# Patient Record
Sex: Female | Born: 1975 | Race: White | Hispanic: No | Marital: Married | Smoking: Never smoker
Health system: Southern US, Community
[De-identification: ages and names within clinical notes are randomized; demographics above are authoritative.]

## PROBLEM LIST (undated history)

## (undated) DIAGNOSIS — E119 Type 2 diabetes mellitus without complications: Secondary | ICD-10-CM

## (undated) DIAGNOSIS — E785 Hyperlipidemia, unspecified: Secondary | ICD-10-CM

## (undated) DIAGNOSIS — F32A Depression, unspecified: Secondary | ICD-10-CM

## (undated) DIAGNOSIS — G4730 Sleep apnea, unspecified: Secondary | ICD-10-CM

## (undated) DIAGNOSIS — I10 Essential (primary) hypertension: Secondary | ICD-10-CM

## (undated) DIAGNOSIS — F329 Major depressive disorder, single episode, unspecified: Secondary | ICD-10-CM

## (undated) HISTORY — PX: ABDOMINAL HYSTERECTOMY: SHX81

## (undated) HISTORY — PX: GALLBLADDER SURGERY: SHX652

## (undated) HISTORY — PX: OTHER SURGICAL HISTORY: SHX169

---

## 2017-12-23 ENCOUNTER — Ambulatory Visit (INDEPENDENT_AMBULATORY_CARE_PROVIDER_SITE_OTHER): Payer: Managed Care, Other (non HMO)

## 2017-12-23 ENCOUNTER — Ambulatory Visit
Admission: EM | Admit: 2017-12-23 | Discharge: 2017-12-23 | Disposition: A | Discharge status: Home / Self Care | Payer: Managed Care, Other (non HMO) | Attending: Family Medicine | Admitting: Family Medicine

## 2017-12-23 ENCOUNTER — Encounter: Payer: Self-pay | Admitting: Emergency Medicine

## 2017-12-23 ENCOUNTER — Other Ambulatory Visit: Payer: Self-pay

## 2017-12-23 DIAGNOSIS — M722 Plantar fascial fibromatosis: Secondary | ICD-10-CM

## 2017-12-23 DIAGNOSIS — M79671 Pain in right foot: Secondary | ICD-10-CM

## 2017-12-23 HISTORY — DX: Type 2 diabetes mellitus without complications: E11.9

## 2017-12-23 HISTORY — DX: Essential (primary) hypertension: I10

## 2017-12-23 HISTORY — DX: Major depressive disorder, single episode, unspecified: F32.9

## 2017-12-23 HISTORY — DX: Sleep apnea, unspecified: G47.30

## 2017-12-23 HISTORY — DX: Hyperlipidemia, unspecified: E78.5

## 2017-12-23 HISTORY — DX: Depression, unspecified: F32.A

## 2017-12-23 MED ORDER — MELOXICAM 15 MG PO TABS: 15.0000 mg | ORAL_TABLET | Freq: Every day | ORAL | 0 refills | Status: DC | PRN

## 2017-12-23 NOTE — ED Provider Notes (Signed)
[facility] ____________________________________________  Time seen: Approximately 5:58 PM  I have reviewed the triage vital signs and the nursing notes.   HISTORY  Chief Complaint Foot Pain   HPI Alicia Morris is a 42 y.o. female presented for evaluation of right plantar foot pain since yesterday.  States she just recently started a new job yesterday and walked 3 miles which was atypical for her.  Patient states there were no falls or direct injuries or abrupt onset of pain.  States while walking she felt very minimal pain to her right heel that gradually worsened last night and had further worsened when she woke up this morning.  States pain to put any weight on her foot.  Has continued to ambulate but putting weight on her toes.  States that she has full range of motion but pain with moving her foot up and down.  Patient reports that she has a history of plantar fasciitis bilateral, but has not had any issues in several years.  Has tried over-the-counter NSAID and compression sleeves to foot today.  Denies other aggravating or alleviating factors.  No rash, pain radiation, paresthesias, extremity swelling or other complaints.  Reports otherwise feels well. Denies recent sickness. Denies recent antibiotic use.    Past Medical History:  Diagnosis Date  . Depression   . Diabetes mellitus without complication (HCC)   . Hyperlipidemia   . Hypertension   . Sleep apnea     There are no active problems to display for this patient.   Past Surgical History:  Procedure Laterality Date  . ABDOMINAL HYSTERECTOMY    . CESAREAN SECTION    . DNC    . GALLBLADDER SURGERY       No current facility-administered medications for this encounter.   Current Outpatient Medications:  .  FLUoxetine (PROZAC) 40 MG capsule, Take 40 mg by mouth daily., Disp: , Rfl:  .  losartan (COZAAR) 50 MG tablet, Take 50 mg by mouth daily., Disp: , Rfl:  .  metFORMIN (GLUCOPHAGE) 1000 MG tablet, Take  1,000 mg by mouth 2 (two) times daily with a meal., Disp: , Rfl:  .  meloxicam (MOBIC) 15 MG tablet, Take 1 tablet (15 mg total) by mouth daily as needed., Disp: 7 tablet, Rfl: 0  Allergies Codeine  No family history on file.  Social History Social History   Tobacco Use  . Smoking status: Never Smoker  . Smokeless tobacco: Never Used  Substance Use Topics  . Alcohol use: Never    Frequency: Never  . Drug use: Never    Review of Systems Constitutional: No fever/chills Cardiovascular: Denies chest pain. Respiratory: Denies shortness of breath. Gastrointestinal: No abdominal pain.   Musculoskeletal: Negative for back pain. As above. Skin: Negative for rash.   ____________________________________________   PHYSICAL EXAM:  VITAL SIGNS: ED Triage Vitals  Enc Vitals Group     BP 12/23/17 1735 126/67     Pulse Rate 12/23/17 1735 74     Resp 12/23/17 1735 16     Temp 12/23/17 1735 98.8 F (37.1 C)     Temp Source 12/23/17 1735 Oral     SpO2 12/23/17 1735 98 %     Weight 12/23/17 1737 250 lb (113.4 kg)     Height 12/23/17 1737 5\' 4"  (1.626 m)     Head Circumference --      Peak Flow --      Pain Score 12/23/17 1734 7     Pain Loc --  Pain Edu? --      Excl. in [place]? --     Constitutional: Alert and oriented. Well appearing and in no acute distress. ENT      Head: Normocephalic and atraumatic. Cardiovascular: Normal rate, regular rhythm. Grossly normal heart sounds.  Good peripheral circulation. Respiratory: Normal respiratory effort without tachypnea nor retractions. Breath sounds are clear and equal bilaterally. No wheezes, rales, rhonchi. Musculoskeletal: Bilateral pedal pulses equal and easily palpated. Except: Right plantar heel and proximal arch moderate tenderness to direct palpation, no swelling, no ecchymosis, skin intact, full range of motion present to right foot but pain present with plantarflexion and dorsiflexion, normal distal sensation and capillary  refill, no point bony tenderness.Antalgic gait.  Neurologic:  Normal speech and language.  Speech is normal.  Skin:  Skin is warm, dry and intact. No rash noted. Psychiatric: Mood and affect are normal. Speech and behavior are normal. Patient exhibits appropriate insight and judgment   ___________________________________________   LABS (all labs ordered are listed, but only abnormal results are displayed)  Labs Reviewed - No data to display  RADIOLOGY  Dg Foot Complete Right  Result Date: 12/23/2017 CLINICAL DATA:  Right foot pain since yesterday. EXAM: RIGHT FOOT COMPLETE - 3+ VIEW COMPARISON:  None. FINDINGS: There is no evidence of fracture or dislocation. There is no evidence of arthropathy or other focal bone abnormality. Soft tissues are unremarkable. IMPRESSION: Negative. Electronically Signed   By: Kennith Center M.D.   On: 12/23/2017 18:17   ____________________________________________   PROCEDURES Procedures     INITIAL IMPRESSION / ASSESSMENT AND PLAN / ED COURSE  Pertinent labs & imaging results that were available during my care of the patient were reviewed by me and considered in my medical decision making (see chart for details).   Well-appearing patient.  No acute distress.  Right plantar foot pain.  Suspect plantar fasciitis, also discussed as weakness of pain onset, concern also for partial tear.  Will evaluate x-ray.  X-ray as above per radiologist and reviewed with patient.  Will place in Cam walking boot.  Daily Mobic.  Follow-up with podiatry.  Encouraged ice, stretching, supportive care.Discussed indication, risks and benefits of medications with patient.  Discussed follow up and return parameters including no resolution or any worsening concerns. Patient verbalized understanding and agreed to plan.   ____________________________________________   FINAL CLINICAL IMPRESSION(S) / ED DIAGNOSES  Final diagnoses:  Foot pain, right  Plantar fasciitis      ED Discharge Orders        Ordered    meloxicam (MOBIC) 15 MG tablet  Daily PRN     12/23/17 1826       Note: This dictation was prepared with Dragon dictation along with smaller phrase technology. Any transcriptional errors that result from this process are unintentional.         Renford Dills, NP 12/23/17 1836

## 2017-12-23 NOTE — Discharge Instructions (Addendum)
Take medication as prescribed. Rest. Drink plenty of fluids.use walker boot.  Follow-up with podiatry this week  Follow up with your primary care physician this week as needed. Return to Urgent care for new or worsening concerns.

## 2017-12-23 NOTE — ED Triage Notes (Signed)
Patient stated that she has painful right foot pain that started on yesterday. Hard to walk or put pressure on foot. Pain mainly toward arch and heel of foot.

## 2019-08-27 ENCOUNTER — Ambulatory Visit: Payer: Self-pay | Attending: Internal Medicine

## 2019-08-27 DIAGNOSIS — Z23 Encounter for immunization: Secondary | ICD-10-CM

## 2019-08-27 NOTE — Progress Notes (Signed)
   Covid-19 Vaccination Clinic  Name:  Rylynne Schicker    MRN: [id number] DOB: 1976/04/25  08/27/2019  Ms. Yaworski was observed post Covid-19 immunization for 15 minutes without incident. She was provided with Vaccine Information Sheet and instruction to access the V-Safe system.   Ms. Lupa was instructed to call 911 with any severe reactions post vaccine: Marland Kitchen Difficulty breathing  . Swelling of face and throat  . A fast heartbeat  . A bad rash all over body  . Dizziness and weakness   Immunizations Administered    Name Date Dose VIS Date Route   Pfizer COVID-19 Vaccine 08/27/2019 11:07 AM 0.3 mL 05/06/2019 Intramuscular   Manufacturer: [employer], [employer]   Lot: [id number]   NDC: [address]-5

## 2019-09-17 ENCOUNTER — Ambulatory Visit: Payer: Self-pay | Attending: Internal Medicine

## 2019-09-17 DIAGNOSIS — Z23 Encounter for immunization: Secondary | ICD-10-CM

## 2019-09-17 NOTE — Progress Notes (Signed)
   Covid-19 Vaccination Clinic  Name:  Alicia Morris    MRN: [id number] DOB: 1976/05/26  09/17/2019  Alicia Morris was observed post Covid-19 immunization for 15 minutes without incident. Alicia Morris was provided with Vaccine Information Sheet and instruction to access the V-Safe system.   Alicia Morris was instructed to call 911 with any severe reactions post vaccine: Marland Kitchen Difficulty breathing  . Swelling of face and throat  . A fast heartbeat  . A bad rash all over body  . Dizziness and weakness   Immunizations Administered    Name Date Dose VIS Date Route   Pfizer COVID-19 Vaccine 09/17/2019 10:53 AM 0.3 mL 07/20/2018 Intramuscular   Manufacturer: [employer], [employer]   Lot: [id number]   NDC: [address]-6

## 2019-11-06 ENCOUNTER — Other Ambulatory Visit: Payer: Self-pay

## 2019-11-06 ENCOUNTER — Encounter: Payer: Self-pay | Admitting: Emergency Medicine

## 2019-11-06 ENCOUNTER — Ambulatory Visit
Admission: EM | Admit: 2019-11-06 | Discharge: 2019-11-06 | Disposition: A | Discharge status: Home / Self Care | Payer: BC Managed Care – PPO | Attending: Internal Medicine | Admitting: Internal Medicine

## 2019-11-06 DIAGNOSIS — S93601A Unspecified sprain of right foot, initial encounter: Secondary | ICD-10-CM

## 2019-11-06 MED ORDER — KETOROLAC TROMETHAMINE 60 MG/2ML IM SOLN
30.0000 mg | Freq: Once | INTRAMUSCULAR | Status: AC
  Administered 2019-11-06: 30 mg via INTRAMUSCULAR

## 2019-11-06 MED ORDER — PREDNISONE 20 MG PO TABS: 20.0000 mg | ORAL_TABLET | Freq: Every day | ORAL | 0 refills | Status: AC

## 2019-11-06 NOTE — ED Triage Notes (Signed)
Patient c/o right foot pain that started yesterday.  Patient c/o pain at the outer side and under the heel of her right foot. Patient denies injury or fall.

## 2019-11-06 NOTE — ED Provider Notes (Signed)
[facility]    CSN: [id number] Arrival date & time: 11/06/19  1435      History   Chief Complaint Chief Complaint  Patient presents with  . Foot Pain    right    HPI Alicia Morris is a 44 y.o. female with a history of plantar fascitis comes to the urgent care with right foot pain which started last night. She twisted her ankle the day before.  Yesterday they went shopping in [place] and she walked around quite a bit.  Following that she is having severe right foot pain.  Pain is mainly in the lateral aspect of the right foot, aggravated by walking and not relieved by Advil.  Pain is from the heel and radiates to the fifth toe.  No swelling of the foot.  No erythema or bruising.  HPI  Past Medical History:  Diagnosis Date  . Depression   . Diabetes mellitus without complication (HCC)   . Hyperlipidemia   . Hypertension   . Sleep apnea     There are no problems to display for this patient.   Past Surgical History:  Procedure Laterality Date  . ABDOMINAL HYSTERECTOMY    . CESAREAN SECTION    . DNC    . GALLBLADDER SURGERY      OB History   No obstetric history on file.      Home Medications    Prior to Admission medications   Medication Sig Start Date End Date Taking? Authorizing Provider  citalopram (CELEXA) 40 MG tablet TAKE 1 TABLET(40 MG) BY MOUTH EVERY DAY 01/25/19  Yes [provider]  glipiZIDE (GLUCOTROL) 10 MG tablet Take by mouth. 09/01/19 08/31/20 Yes [provider]  losartan (COZAAR) 50 MG tablet Take 50 mg by mouth daily.   Yes [provider]  metFORMIN (GLUCOPHAGE) 1000 MG tablet Take 1,000 mg by mouth 2 (two) times daily with a meal.   Yes [provider]  omeprazole (PRILOSEC) 20 MG capsule Take by mouth. 03/07/19  Yes [provider]  FLUoxetine (PROZAC) 40 MG capsule Take 40 mg by mouth daily.  11/06/19 Yes [provider]  meloxicam (MOBIC) 15 MG tablet Take 1 tablet (15 mg total) by  mouth daily as needed. 12/23/17   Renford Dills, NP  Multiple Vitamins-Minerals (MULTIVITAMIN WITH MINERALS) tablet Take 1 tablet by mouth daily.    [provider]  pravastatin (PRAVACHOL) 40 MG tablet Take 40 mg by mouth at bedtime. 09/15/19   [provider]  predniSONE (DELTASONE) 20 MG tablet Take 1 tablet (20 mg total) by mouth daily for 5 days. 11/06/19 11/11/19  Merrilee Jansky, MD  Vitamin D, Ergocalciferol, (DRISDOL) 1.25 MG (50000 UNIT) CAPS capsule  11/06/19   [provider]    Family History Family History  Problem Relation Age of Onset  . Hypertension Mother   . Hypertension Father     Social History Social History   Tobacco Use  . Smoking status: Never Smoker  . Smokeless tobacco: Never Used  Vaping Use  . Vaping Use: Every day  Substance Use Topics  . Alcohol use: Never  . Drug use: Never     Allergies   Codeine   Review of Systems Review of Systems  Constitutional: Negative.   HENT: Negative.   Respiratory: Negative.   Gastrointestinal: Negative.   Musculoskeletal: Positive for arthralgias. Negative for joint swelling, neck pain and neck stiffness.  Skin: Negative.   Neurological: Negative.  Negative for dizziness and  light-headedness.     Physical Exam Triage Vital Signs ED Triage Vitals  Enc Vitals Group     BP 11/06/19 1504 126/61     Pulse Rate 11/06/19 1504 73     Resp 11/06/19 1504 14     Temp 11/06/19 1504 98.4 F (36.9 C)     Temp Source 11/06/19 1504 Oral     SpO2 11/06/19 1504 98 %     Weight 11/06/19 1501 240 lb (108.9 kg)     Height 11/06/19 1501 5\' 4"  (1.626 m)     Head Circumference --      Peak Flow --      Pain Score 11/06/19 1501 8     Pain Loc --      Pain Edu? --      Excl. in [place]? --    No data found.  Updated Vital Signs BP 126/61 (BP Location: Left Arm)   Pulse 73   Temp 98.4 F (36.9 C) (Oral)   Resp 14   Ht 5\' 4"  (1.626 m)   Wt 108.9 kg   SpO2 98%   BMI 41.20 kg/m   Visual  Acuity Right Eye Distance:   Left Eye Distance:   Bilateral Distance:    Right Eye Near:   Left Eye Near:    Bilateral Near:     Physical Exam Vitals and nursing note reviewed.  Constitutional:      General: She is in acute distress.     Appearance: Normal appearance.  Cardiovascular:     Rate and Rhythm: Normal rate and regular rhythm.     Heart sounds: Normal heart sounds.  Abdominal:     General: Bowel sounds are normal.     Palpations: Abdomen is soft.  Musculoskeletal:        General: Tenderness present. No swelling, deformity or signs of injury. Normal range of motion.  Skin:    General: Skin is warm.     Findings: No bruising or erythema.  Neurological:     Mental Status: She is alert.      UC Treatments / Results  Labs (all labs ordered are listed, but only abnormal results are displayed) Labs Reviewed - No data to display  EKG   Radiology No results found.  Procedures Procedures (including critical care time)  Medications Ordered in UC Medications  ketorolac (TORADOL) injection 30 mg (has no administration in time range)    Initial Impression / Assessment and Plan / UC Course  I have reviewed the triage vital signs and the nursing notes.  Pertinent labs & imaging results that were available during my care of the patient were reviewed by me and considered in my medical decision making (see chart for details).     1.  Right foot sprain: Not improving with NSAIDs, icing and splints. Toradol 30 mg IM x1 dose. We will try a short course of steroids in case this is related to the patient's plantar fasciitis. She is advised to continue using the splints Ibuprofen as needed Gentle range of motion and icing of the right foot Return precautions given No indication for imaging at this time. Final Clinical Impressions(s) / UC Diagnoses   Final diagnoses:  Foot sprain, right, initial encounter   Discharge Instructions   None    ED Prescriptions      Medication Sig Dispense Auth. Provider   predniSONE (DELTASONE) 20 MG tablet Take 1 tablet (20 mg total) by mouth daily for 5 days. 5 tablet Frenchie Dangerfield,  Britta Mccreedy, MD     PDMP not reviewed this encounter.   Merrilee Jansky, MD 11/06/19 1537

## 2020-01-13 IMAGING — CR DG FOOT COMPLETE 3+V*R*
4 series · 4 of 4 positions shown · non-contrast
Comparison: None.

CLINICAL DATA: Right foot pain since yesterday.

EXAM:
RIGHT FOOT COMPLETE - 3+ VIEW

[foot ap]
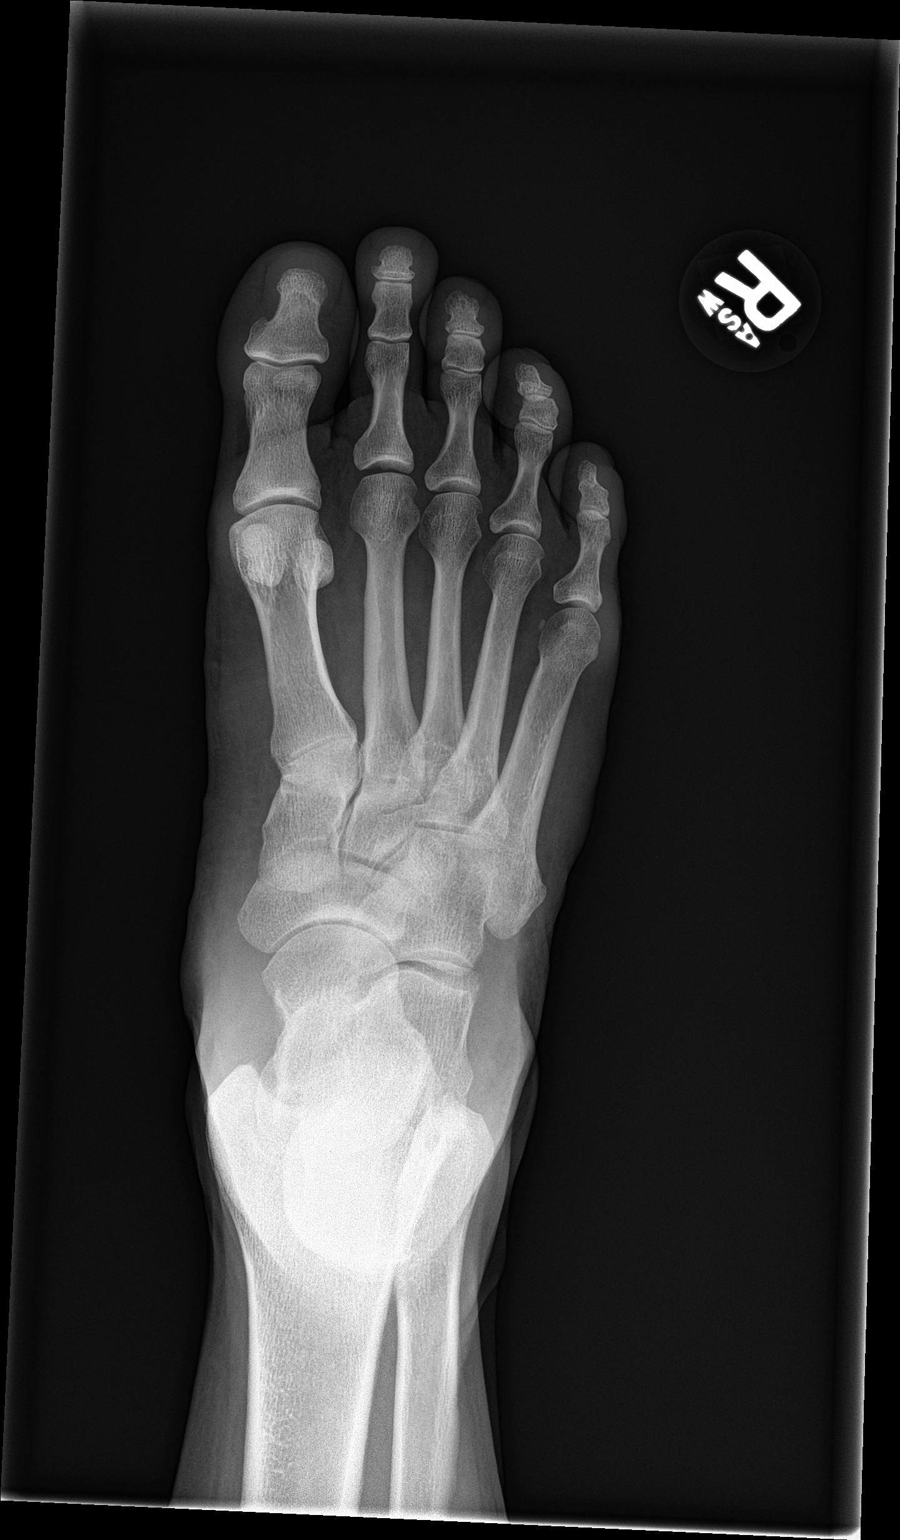

[foot obl]
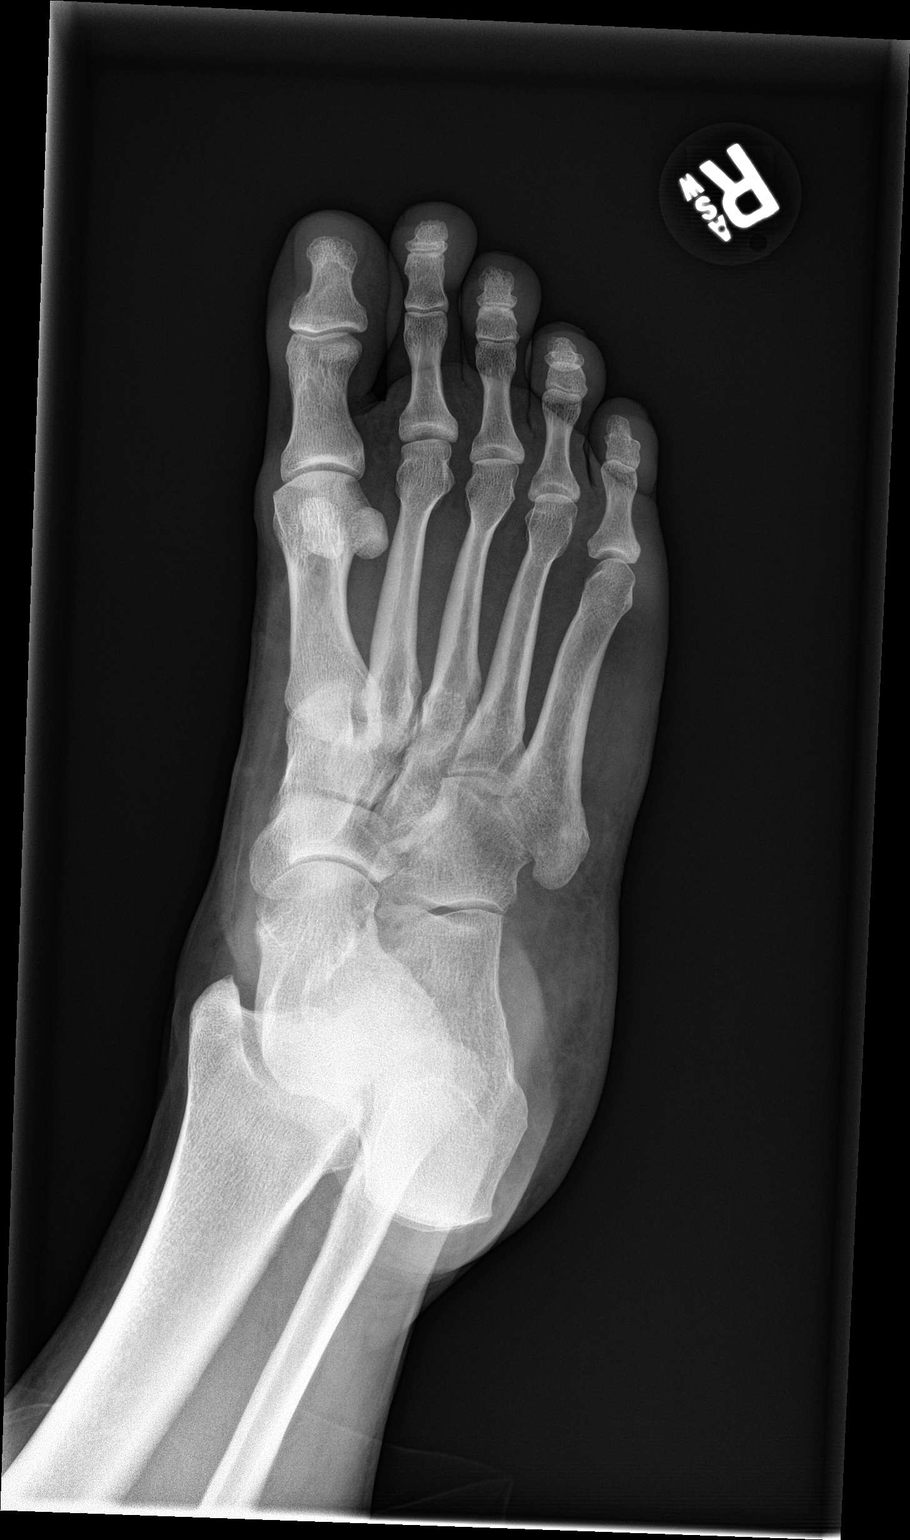

[foot lat (1 of 2)]
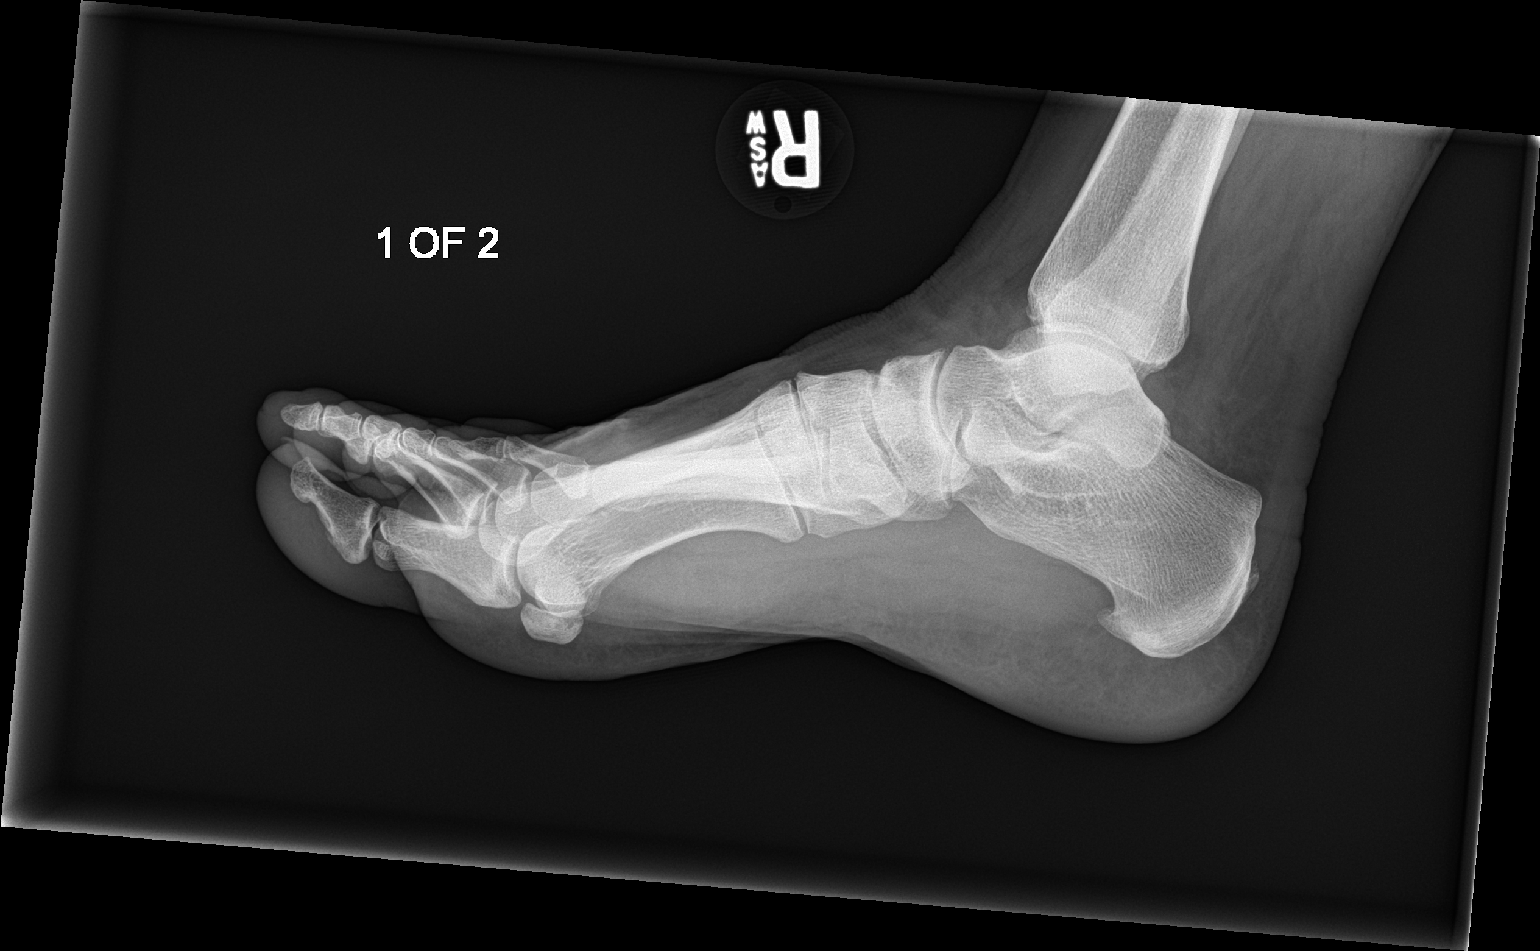

[foot lat (2 of 2)]
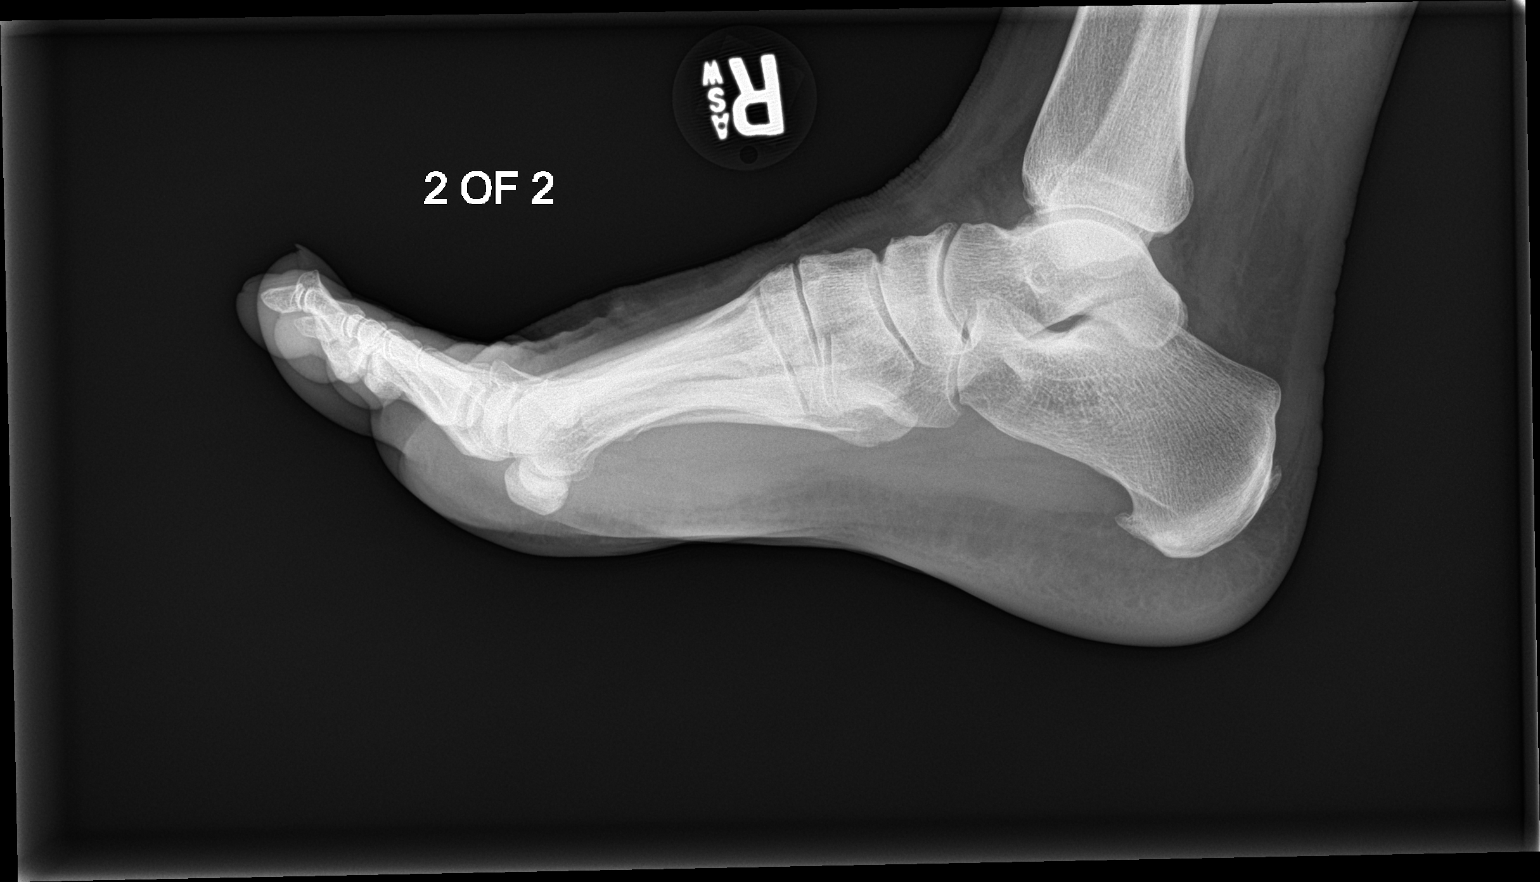

[4 of 4 positions shown; findings below may reference images not displayed]

FINDINGS: There is no evidence of fracture or dislocation. There is no
evidence of arthropathy or other focal bone abnormality. Soft
tissues are unremarkable.
IMPRESSION: Negative.

## 2020-02-03 ENCOUNTER — Other Ambulatory Visit: Payer: Self-pay

## 2020-02-03 ENCOUNTER — Ambulatory Visit
Admission: EM | Admit: 2020-02-03 | Discharge: 2020-02-03 | Disposition: A | Discharge status: Home / Self Care | Payer: BC Managed Care – PPO | Attending: Emergency Medicine | Admitting: Emergency Medicine

## 2020-02-03 DIAGNOSIS — S46812A Strain of other muscles, fascia and tendons at shoulder and upper arm level, left arm, initial encounter: Secondary | ICD-10-CM

## 2020-02-03 DIAGNOSIS — S161XXA Strain of muscle, fascia and tendon at neck level, initial encounter: Secondary | ICD-10-CM | POA: Diagnosis not present

## 2020-02-03 DIAGNOSIS — S39012A Strain of muscle, fascia and tendon of lower back, initial encounter: Secondary | ICD-10-CM | POA: Diagnosis not present

## 2020-02-03 MED ORDER — IBUPROFEN 600 MG PO TABS: 600.0000 mg | ORAL_TABLET | Freq: Four times a day (QID) | ORAL | 0 refills | Status: AC | PRN

## 2020-02-03 MED ORDER — TIZANIDINE HCL 4 MG PO TABS: 4.0000 mg | ORAL_TABLET | Freq: Three times a day (TID) | ORAL | 0 refills | Status: DC | PRN

## 2020-02-03 NOTE — Discharge Instructions (Addendum)
600 mg of ibuprofen combined with 1000 mg of Tylenol together 3-4 times a day as needed for pain.  Zanaflex for muscle spasms.  Warm or cool compresses, whichever feels better.  People tend to feel worse over the next several days, but most people are back to normal in 1 week. A small number of people will have persistent pain for up to six weeks.   Go to www.goodrx.com to look up your medications. This will give you a list of where you can find your prescriptions at the most affordable prices. Or ask the pharmacist what the cash price is, or if they have any other discount programs available to help make your medication more affordable. This can be less expensive than what you would pay with insurance.
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
## 2020-06-08 ENCOUNTER — Other Ambulatory Visit: Payer: Self-pay

## 2020-06-08 DIAGNOSIS — Z20822 Contact with and (suspected) exposure to covid-19: Secondary | ICD-10-CM

## 2020-06-12 LAB — NOVEL CORONAVIRUS, NAA: SARS-CoV-2, NAA: NOT DETECTED

## 2021-03-19 ENCOUNTER — Encounter: Payer: Self-pay | Admitting: Emergency Medicine

## 2021-03-19 ENCOUNTER — Other Ambulatory Visit: Payer: Self-pay

## 2021-03-19 ENCOUNTER — Ambulatory Visit
Admission: EM | Admit: 2021-03-19 | Discharge: 2021-03-19 | Disposition: A | Discharge status: Home / Self Care | Payer: BC Managed Care – PPO | Attending: Emergency Medicine | Admitting: Emergency Medicine

## 2021-03-19 DIAGNOSIS — M5442 Lumbago with sciatica, left side: Secondary | ICD-10-CM | POA: Diagnosis not present

## 2021-03-19 MED ORDER — TIZANIDINE HCL 4 MG PO TABS
4.0000 mg | ORAL_TABLET | Freq: Three times a day (TID) | ORAL | 0 refills | Status: AC | PRN
Start: 2021-03-19 — End: 2021-03-24

## 2021-03-19 NOTE — ED Provider Notes (Signed)
[facility]    CSN: [id number] Arrival date & time: 03/19/21  0809      History   Chief Complaint Chief Complaint  Patient presents with   Back Pain   Leg Pain    HPI Alicia Morris is a 45 y.o. female.   45 yr old female pt, Alicia Morris, presents to UC with complaint of low back pain w radiation down left leg, "flare of sciatica". Saw PCP and was scripted prednisone, has used Zanaflex in past and worked well, but has run out of meds from last year. No loss of bowel and bladder, no loss of function, no foot drop. No saddle numbness.  The history is provided by the patient. No language interpreter was used.  Back Pain Location:  Lumbar spine Quality:  Aching and burning Radiates to: left thigh.leg. Pain severity:  Severe Worse during: fluctuates. Onset quality:  Gradual Duration:  6 days Timing:  Intermittent Progression:  Waxing and waning Chronicity:  Recurrent Context: not recent injury   Context comment:  "flare up of sciatica" Worsened by:  Standing Ineffective treatments:  Ibuprofen, heating pad and cold packs Associated symptoms: leg pain   Risk factors: steroid use   Leg Pain Associated symptoms: back pain    Past Medical History:  Diagnosis Date   Depression    Diabetes mellitus without complication (HCC)    Hyperlipidemia    Hypertension    Sleep apnea     Patient Active Problem List   Diagnosis Date Noted   Acute bilateral low back pain with left-sided sciatica 03/19/2021     Past Surgical History:  Procedure Laterality Date   ABDOMINAL HYSTERECTOMY     CESAREAN SECTION     DNC     GALLBLADDER SURGERY      OB History   No obstetric history on file.      Home Medications    Prior to Admission medications   Medication Sig Start Date End Date Taking? Authorizing Provider  ibuprofen (ADVIL) 600 MG tablet Take 1 tablet (600 mg total) by mouth every 6 (six) hours as needed. 02/03/20  Yes Domenick Gong, MD   amphetamine-dextroamphetamine (ADDERALL XR) 15 MG 24 hr capsule Take by mouth. 12/30/19 03/29/20  [provider]  citalopram (CELEXA) 40 MG tablet TAKE 1 TABLET(40 MG) BY MOUTH EVERY DAY 01/25/19   [provider]  glipiZIDE (GLUCOTROL) 10 MG tablet Take by mouth. 09/01/19 08/31/20  [provider]  losartan (COZAAR) 50 MG tablet Take 50 mg by mouth daily.    [provider]  metFORMIN (GLUCOPHAGE) 1000 MG tablet Take 1,000 mg by mouth 2 (two) times daily with a meal.    [provider]  Multiple Vitamins-Minerals (MULTIVITAMIN WITH MINERALS) tablet Take 1 tablet by mouth daily.    [provider]  omeprazole (PRILOSEC) 20 MG capsule Take by mouth. 03/07/19   [provider]  pravastatin (PRAVACHOL) 40 MG tablet Take 40 mg by mouth at bedtime. 09/15/19   [provider]  tiZANidine (ZANAFLEX) 4 MG tablet Take 1 tablet (4 mg total) by mouth every 8 (eight) hours as needed for up to 5 days for muscle spasms. 03/19/21 03/24/21  Ritu Gagliardo, Para March, NP  Vitamin D, Ergocalciferol, (DRISDOL) 1.25 MG (50000 UNIT) CAPS capsule  11/06/19   [provider]  FLUoxetine (PROZAC) 40 MG capsule Take 40 mg by mouth daily.  11/06/19  [provider]    Family History Family History  Problem Relation Age of Onset  Hypertension Mother    Hypertension Father     Social History Social History   Tobacco Use   Smoking status: Never   Smokeless tobacco: Never  Vaping Use   Vaping Use: Every day  Substance Use Topics   Alcohol use: Never   Drug use: Never     Allergies   Codeine   Review of Systems Review of Systems  Musculoskeletal:  Positive for back pain.  All other systems reviewed and are negative.   Physical Exam Triage Vital Signs ED Triage Vitals  Enc Vitals Group     BP 03/19/21 0835 119/85     Pulse Rate 03/19/21 0835 77     Resp 03/19/21 0833 16     Temp 03/19/21 0833 98.5 F (36.9 C)     Temp  Source 03/19/21 0833 Oral     SpO2 03/19/21 0835 96 %     Weight --      Height --      Head Circumference --      Peak Flow --      Pain Score 03/19/21 0830 8     Pain Loc --      Pain Edu? --      Excl. in GC? --    No data found.  Updated Vital Signs BP 119/85   Pulse 77   Temp 98.5 F (36.9 C) (Oral)   Resp 16   SpO2 96%   Visual Acuity Right Eye Distance:   Left Eye Distance:   Bilateral Distance:    Right Eye Near:   Left Eye Near:    Bilateral Near:     Physical Exam Vitals and nursing note reviewed.  Constitutional:      General: She is not in acute distress.    Appearance: She is well-developed. She is not ill-appearing or toxic-appearing.  HENT:     Head: Normocephalic.  Cardiovascular:     Pulses: Normal pulses.          Dorsalis pedis pulses are 2+ on the right side and 2+ on the left side.  Pulmonary:     Effort: Pulmonary effort is normal.  Musculoskeletal:     Lumbar back: Spasms and tenderness present. No swelling, edema, deformity, lacerations or bony tenderness. Normal range of motion.     Comments: -SLE bilateral, dorsiflex/ext equal bilateral, MAEW, no foot drop, NV intact  Skin:    Capillary Refill: Capillary refill takes less than 2 seconds.  Neurological:     General: No focal deficit present.     Mental Status: She is alert and oriented to person, place, and time.     GCS: GCS eye subscore is 4. GCS verbal subscore is 5. GCS motor subscore is 6.     Cranial Nerves: No cranial nerve deficit.     Sensory: No sensory deficit.     Deep Tendon Reflexes: Reflexes are normal and symmetric.  Psychiatric:        Attention and Perception: Attention normal.        Mood and Affect: Mood normal.        Speech: Speech normal.        Behavior: Behavior normal. Behavior is cooperative.     UC Treatments / Results  Labs (all labs ordered are listed, but only abnormal results are displayed) Labs Reviewed - No data to  display  EKG   Radiology No results found.  Procedures Procedures (including critical care time)  Medications Ordered in UC Medications -  No data to display  Initial Impression / Assessment and Plan / UC Course  I have reviewed the triage vital signs and the nursing notes.  Pertinent labs & imaging results that were available during my care of the patient were reviewed by me and considered in my medical decision making (see chart for details).     Ddx: Back pain, lumbar strain, sciatica Final Clinical Impressions(s) / UC Diagnoses   Final diagnoses:  Acute bilateral low back pain with left-sided sciatica     Discharge Instructions      Rest, heat/ice to back as previously directed. Take muscle relaxer as prescribed, follow up with PCP. Go to Er immediately if you develop saddle numbness, loss of bowel and bladder, worsening symptoms.      ED Prescriptions     Medication Sig Dispense Auth. Provider   tiZANidine (ZANAFLEX) 4 MG tablet Take 1 tablet (4 mg total) by mouth every 8 (eight) hours as needed for up to 5 days for muscle spasms. 15 tablet Rasmus Preusser, Para March, NP      PDMP not reviewed this encounter.   Clancy Gourd, NP 03/19/21 [phone]

## 2021-03-19 NOTE — Discharge Instructions (Addendum)
Rest, heat/ice to back as previously directed. Take muscle relaxer as prescribed, follow up with PCP. Go to Er immediately if you develop saddle numbness, loss of bowel and bladder, worsening symptoms.

## 2021-03-19 NOTE — ED Triage Notes (Addendum)
Pt presents today with c/o of lower back pain radiating down left leg x 6 days. She went to PCP last week and she was put on Prednisone taper which is making her jittery. She reports not able to stand or put weight on left leg and came in today in wheelchair. She took Ibuprofen at 2am. Denies injury.
